# Patient Record
Sex: Female | Born: 1945 | Race: White | Hispanic: No | Smoking: Current every day smoker
Health system: Southern US, Community
[De-identification: ages and names within clinical notes are randomized; demographics above are authoritative.]

## PROBLEM LIST (undated history)

## (undated) DIAGNOSIS — E079 Disorder of thyroid, unspecified: Secondary | ICD-10-CM

## (undated) HISTORY — DX: Disorder of thyroid, unspecified: E07.9

---

## 2011-08-06 DIAGNOSIS — D497 Neoplasm of unspecified behavior of endocrine glands and other parts of nervous system: Secondary | ICD-10-CM | POA: Diagnosis not present

## 2011-08-06 DIAGNOSIS — D34 Benign neoplasm of thyroid gland: Secondary | ICD-10-CM | POA: Diagnosis not present

## 2011-09-12 DIAGNOSIS — Z79899 Other long term (current) drug therapy: Secondary | ICD-10-CM | POA: Diagnosis not present

## 2011-09-12 DIAGNOSIS — E042 Nontoxic multinodular goiter: Secondary | ICD-10-CM | POA: Diagnosis not present

## 2011-09-12 DIAGNOSIS — I10 Essential (primary) hypertension: Secondary | ICD-10-CM | POA: Diagnosis not present

## 2011-09-12 DIAGNOSIS — E785 Hyperlipidemia, unspecified: Secondary | ICD-10-CM | POA: Diagnosis not present

## 2011-11-26 DIAGNOSIS — I2510 Atherosclerotic heart disease of native coronary artery without angina pectoris: Secondary | ICD-10-CM | POA: Diagnosis not present

## 2011-12-26 DIAGNOSIS — R079 Chest pain, unspecified: Secondary | ICD-10-CM | POA: Diagnosis not present

## 2011-12-26 DIAGNOSIS — I2510 Atherosclerotic heart disease of native coronary artery without angina pectoris: Secondary | ICD-10-CM | POA: Diagnosis not present

## 2011-12-26 DIAGNOSIS — R0602 Shortness of breath: Secondary | ICD-10-CM | POA: Diagnosis not present

## 2012-03-03 DIAGNOSIS — M79609 Pain in unspecified limb: Secondary | ICD-10-CM | POA: Diagnosis not present

## 2012-04-18 DIAGNOSIS — M79609 Pain in unspecified limb: Secondary | ICD-10-CM | POA: Diagnosis not present

## 2012-04-18 DIAGNOSIS — F411 Generalized anxiety disorder: Secondary | ICD-10-CM | POA: Diagnosis not present

## 2012-04-18 DIAGNOSIS — Z23 Encounter for immunization: Secondary | ICD-10-CM | POA: Diagnosis not present

## 2012-09-01 DIAGNOSIS — I2510 Atherosclerotic heart disease of native coronary artery without angina pectoris: Secondary | ICD-10-CM | POA: Diagnosis not present

## 2012-09-01 DIAGNOSIS — Z01810 Encounter for preprocedural cardiovascular examination: Secondary | ICD-10-CM | POA: Diagnosis not present

## 2012-09-01 DIAGNOSIS — I10 Essential (primary) hypertension: Secondary | ICD-10-CM | POA: Diagnosis not present

## 2012-09-01 DIAGNOSIS — E785 Hyperlipidemia, unspecified: Secondary | ICD-10-CM | POA: Diagnosis not present

## 2012-09-01 DIAGNOSIS — G459 Transient cerebral ischemic attack, unspecified: Secondary | ICD-10-CM | POA: Diagnosis not present

## 2012-09-15 DIAGNOSIS — M79609 Pain in unspecified limb: Secondary | ICD-10-CM | POA: Diagnosis not present

## 2012-09-15 DIAGNOSIS — G459 Transient cerebral ischemic attack, unspecified: Secondary | ICD-10-CM | POA: Diagnosis not present

## 2012-09-18 DIAGNOSIS — Z79899 Other long term (current) drug therapy: Secondary | ICD-10-CM | POA: Diagnosis not present

## 2012-09-18 DIAGNOSIS — I10 Essential (primary) hypertension: Secondary | ICD-10-CM | POA: Diagnosis not present

## 2012-09-18 DIAGNOSIS — E042 Nontoxic multinodular goiter: Secondary | ICD-10-CM | POA: Diagnosis not present

## 2012-09-18 DIAGNOSIS — F411 Generalized anxiety disorder: Secondary | ICD-10-CM | POA: Diagnosis not present

## 2012-09-18 DIAGNOSIS — E785 Hyperlipidemia, unspecified: Secondary | ICD-10-CM | POA: Diagnosis not present

## 2012-10-23 DIAGNOSIS — B009 Herpesviral infection, unspecified: Secondary | ICD-10-CM | POA: Diagnosis not present

## 2012-10-23 DIAGNOSIS — R509 Fever, unspecified: Secondary | ICD-10-CM | POA: Diagnosis not present

## 2012-10-23 DIAGNOSIS — K130 Diseases of lips: Secondary | ICD-10-CM | POA: Diagnosis not present

## 2012-12-01 DIAGNOSIS — M549 Dorsalgia, unspecified: Secondary | ICD-10-CM | POA: Diagnosis not present

## 2013-08-03 DIAGNOSIS — M549 Dorsalgia, unspecified: Secondary | ICD-10-CM | POA: Diagnosis not present

## 2013-08-13 DIAGNOSIS — M545 Low back pain, unspecified: Secondary | ICD-10-CM | POA: Diagnosis not present

## 2013-08-13 DIAGNOSIS — S335XXA Sprain of ligaments of lumbar spine, initial encounter: Secondary | ICD-10-CM | POA: Diagnosis not present

## 2013-08-13 DIAGNOSIS — IMO0002 Reserved for concepts with insufficient information to code with codable children: Secondary | ICD-10-CM | POA: Diagnosis not present

## 2013-08-13 DIAGNOSIS — W108XXA Fall (on) (from) other stairs and steps, initial encounter: Secondary | ICD-10-CM | POA: Diagnosis not present

## 2013-10-05 DIAGNOSIS — Z9181 History of falling: Secondary | ICD-10-CM | POA: Diagnosis not present

## 2013-10-05 DIAGNOSIS — I10 Essential (primary) hypertension: Secondary | ICD-10-CM | POA: Diagnosis not present

## 2013-10-05 DIAGNOSIS — Z8673 Personal history of transient ischemic attack (TIA), and cerebral infarction without residual deficits: Secondary | ICD-10-CM | POA: Diagnosis not present

## 2013-10-05 DIAGNOSIS — J449 Chronic obstructive pulmonary disease, unspecified: Secondary | ICD-10-CM | POA: Diagnosis not present

## 2013-10-05 DIAGNOSIS — Z79899 Other long term (current) drug therapy: Secondary | ICD-10-CM | POA: Diagnosis not present

## 2013-10-05 DIAGNOSIS — IMO0002 Reserved for concepts with insufficient information to code with codable children: Secondary | ICD-10-CM | POA: Diagnosis not present

## 2013-10-05 DIAGNOSIS — W108XXA Fall (on) (from) other stairs and steps, initial encounter: Secondary | ICD-10-CM | POA: Diagnosis not present

## 2013-10-05 DIAGNOSIS — M549 Dorsalgia, unspecified: Secondary | ICD-10-CM | POA: Diagnosis not present

## 2013-11-14 DIAGNOSIS — R52 Pain, unspecified: Secondary | ICD-10-CM | POA: Diagnosis not present

## 2013-11-14 DIAGNOSIS — M545 Low back pain, unspecified: Secondary | ICD-10-CM | POA: Diagnosis not present

## 2013-11-14 DIAGNOSIS — G8929 Other chronic pain: Secondary | ICD-10-CM | POA: Diagnosis not present

## 2013-11-14 DIAGNOSIS — I10 Essential (primary) hypertension: Secondary | ICD-10-CM | POA: Diagnosis not present

## 2013-11-14 DIAGNOSIS — J449 Chronic obstructive pulmonary disease, unspecified: Secondary | ICD-10-CM | POA: Diagnosis not present

## 2013-11-14 DIAGNOSIS — IMO0002 Reserved for concepts with insufficient information to code with codable children: Secondary | ICD-10-CM | POA: Diagnosis not present

## 2013-11-14 DIAGNOSIS — S335XXA Sprain of ligaments of lumbar spine, initial encounter: Secondary | ICD-10-CM | POA: Diagnosis not present

## 2013-11-14 DIAGNOSIS — R296 Repeated falls: Secondary | ICD-10-CM | POA: Diagnosis not present

## 2013-11-14 DIAGNOSIS — F17200 Nicotine dependence, unspecified, uncomplicated: Secondary | ICD-10-CM | POA: Diagnosis not present

## 2013-11-14 DIAGNOSIS — Z79899 Other long term (current) drug therapy: Secondary | ICD-10-CM | POA: Diagnosis not present

## 2013-12-08 DIAGNOSIS — IMO0002 Reserved for concepts with insufficient information to code with codable children: Secondary | ICD-10-CM | POA: Diagnosis not present

## 2013-12-29 DIAGNOSIS — M47817 Spondylosis without myelopathy or radiculopathy, lumbosacral region: Secondary | ICD-10-CM | POA: Diagnosis not present

## 2014-01-12 DIAGNOSIS — IMO0002 Reserved for concepts with insufficient information to code with codable children: Secondary | ICD-10-CM | POA: Diagnosis not present

## 2014-03-02 DIAGNOSIS — F17200 Nicotine dependence, unspecified, uncomplicated: Secondary | ICD-10-CM | POA: Diagnosis not present

## 2014-03-02 DIAGNOSIS — IMO0002 Reserved for concepts with insufficient information to code with codable children: Secondary | ICD-10-CM | POA: Diagnosis not present

## 2014-03-16 DIAGNOSIS — E785 Hyperlipidemia, unspecified: Secondary | ICD-10-CM | POA: Diagnosis not present

## 2014-03-16 DIAGNOSIS — R079 Chest pain, unspecified: Secondary | ICD-10-CM | POA: Diagnosis not present

## 2014-03-16 DIAGNOSIS — Z79899 Other long term (current) drug therapy: Secondary | ICD-10-CM | POA: Diagnosis not present

## 2014-03-16 DIAGNOSIS — I2510 Atherosclerotic heart disease of native coronary artery without angina pectoris: Secondary | ICD-10-CM | POA: Diagnosis not present

## 2014-03-16 DIAGNOSIS — I10 Essential (primary) hypertension: Secondary | ICD-10-CM | POA: Diagnosis not present

## 2014-04-04 DIAGNOSIS — Z23 Encounter for immunization: Secondary | ICD-10-CM | POA: Diagnosis not present

## 2014-04-06 DIAGNOSIS — M47817 Spondylosis without myelopathy or radiculopathy, lumbosacral region: Secondary | ICD-10-CM | POA: Diagnosis not present

## 2014-04-22 DIAGNOSIS — E785 Hyperlipidemia, unspecified: Secondary | ICD-10-CM | POA: Diagnosis not present

## 2014-04-22 DIAGNOSIS — F17200 Nicotine dependence, unspecified, uncomplicated: Secondary | ICD-10-CM | POA: Diagnosis not present

## 2014-04-22 DIAGNOSIS — F411 Generalized anxiety disorder: Secondary | ICD-10-CM | POA: Diagnosis not present

## 2014-04-22 DIAGNOSIS — I10 Essential (primary) hypertension: Secondary | ICD-10-CM | POA: Diagnosis not present

## 2014-04-22 DIAGNOSIS — Z79899 Other long term (current) drug therapy: Secondary | ICD-10-CM | POA: Diagnosis not present

## 2014-05-27 DIAGNOSIS — R928 Other abnormal and inconclusive findings on diagnostic imaging of breast: Secondary | ICD-10-CM | POA: Diagnosis not present

## 2014-05-27 DIAGNOSIS — Z1231 Encounter for screening mammogram for malignant neoplasm of breast: Secondary | ICD-10-CM | POA: Diagnosis not present

## 2014-07-01 DIAGNOSIS — R928 Other abnormal and inconclusive findings on diagnostic imaging of breast: Secondary | ICD-10-CM | POA: Diagnosis not present

## 2014-07-01 DIAGNOSIS — N63 Unspecified lump in breast: Secondary | ICD-10-CM | POA: Diagnosis not present

## 2014-07-13 DIAGNOSIS — M79604 Pain in right leg: Secondary | ICD-10-CM | POA: Diagnosis not present

## 2014-07-13 DIAGNOSIS — M545 Low back pain: Secondary | ICD-10-CM | POA: Diagnosis not present

## 2014-08-17 DIAGNOSIS — R0789 Other chest pain: Secondary | ICD-10-CM | POA: Diagnosis not present

## 2014-08-26 DIAGNOSIS — J449 Chronic obstructive pulmonary disease, unspecified: Secondary | ICD-10-CM | POA: Diagnosis not present

## 2014-08-26 DIAGNOSIS — R918 Other nonspecific abnormal finding of lung field: Secondary | ICD-10-CM | POA: Diagnosis not present

## 2014-08-26 DIAGNOSIS — R0789 Other chest pain: Secondary | ICD-10-CM | POA: Diagnosis not present

## 2014-08-26 DIAGNOSIS — F17210 Nicotine dependence, cigarettes, uncomplicated: Secondary | ICD-10-CM | POA: Diagnosis not present

## 2014-08-26 DIAGNOSIS — I77810 Thoracic aortic ectasia: Secondary | ICD-10-CM | POA: Diagnosis not present

## 2014-08-26 DIAGNOSIS — R1011 Right upper quadrant pain: Secondary | ICD-10-CM | POA: Diagnosis not present

## 2014-08-26 DIAGNOSIS — I10 Essential (primary) hypertension: Secondary | ICD-10-CM | POA: Diagnosis not present

## 2014-08-26 DIAGNOSIS — J439 Emphysema, unspecified: Secondary | ICD-10-CM | POA: Diagnosis not present

## 2014-09-15 DIAGNOSIS — I10 Essential (primary) hypertension: Secondary | ICD-10-CM | POA: Diagnosis not present

## 2014-09-15 DIAGNOSIS — F17210 Nicotine dependence, cigarettes, uncomplicated: Secondary | ICD-10-CM | POA: Diagnosis not present

## 2014-09-15 DIAGNOSIS — J180 Bronchopneumonia, unspecified organism: Secondary | ICD-10-CM | POA: Diagnosis not present

## 2014-09-15 DIAGNOSIS — Z8673 Personal history of transient ischemic attack (TIA), and cerebral infarction without residual deficits: Secondary | ICD-10-CM | POA: Diagnosis not present

## 2014-09-15 DIAGNOSIS — E780 Pure hypercholesterolemia: Secondary | ICD-10-CM | POA: Diagnosis not present

## 2014-09-15 DIAGNOSIS — J449 Chronic obstructive pulmonary disease, unspecified: Secondary | ICD-10-CM | POA: Diagnosis not present

## 2014-09-20 DIAGNOSIS — I10 Essential (primary) hypertension: Secondary | ICD-10-CM | POA: Diagnosis not present

## 2014-09-20 DIAGNOSIS — I2510 Atherosclerotic heart disease of native coronary artery without angina pectoris: Secondary | ICD-10-CM | POA: Diagnosis not present

## 2014-09-20 DIAGNOSIS — R0602 Shortness of breath: Secondary | ICD-10-CM | POA: Diagnosis not present

## 2014-09-20 DIAGNOSIS — E785 Hyperlipidemia, unspecified: Secondary | ICD-10-CM | POA: Diagnosis not present

## 2014-09-28 DIAGNOSIS — R079 Chest pain, unspecified: Secondary | ICD-10-CM | POA: Diagnosis not present

## 2014-10-27 DIAGNOSIS — R319 Hematuria, unspecified: Secondary | ICD-10-CM | POA: Diagnosis not present

## 2014-10-27 DIAGNOSIS — F5104 Psychophysiologic insomnia: Secondary | ICD-10-CM | POA: Diagnosis not present

## 2014-10-27 DIAGNOSIS — R300 Dysuria: Secondary | ICD-10-CM | POA: Diagnosis not present

## 2014-10-27 DIAGNOSIS — I159 Secondary hypertension, unspecified: Secondary | ICD-10-CM | POA: Diagnosis not present

## 2014-10-27 DIAGNOSIS — F419 Anxiety disorder, unspecified: Secondary | ICD-10-CM | POA: Diagnosis not present

## 2014-10-27 DIAGNOSIS — M1990 Unspecified osteoarthritis, unspecified site: Secondary | ICD-10-CM | POA: Diagnosis not present

## 2014-10-27 DIAGNOSIS — J449 Chronic obstructive pulmonary disease, unspecified: Secondary | ICD-10-CM | POA: Diagnosis not present

## 2014-10-27 DIAGNOSIS — I10 Essential (primary) hypertension: Secondary | ICD-10-CM | POA: Diagnosis not present

## 2014-11-26 DIAGNOSIS — F17200 Nicotine dependence, unspecified, uncomplicated: Secondary | ICD-10-CM | POA: Diagnosis not present

## 2014-11-26 DIAGNOSIS — Z79899 Other long term (current) drug therapy: Secondary | ICD-10-CM | POA: Diagnosis not present

## 2014-11-26 DIAGNOSIS — I10 Essential (primary) hypertension: Secondary | ICD-10-CM | POA: Diagnosis not present

## 2014-11-26 DIAGNOSIS — R319 Hematuria, unspecified: Secondary | ICD-10-CM | POA: Diagnosis not present

## 2014-11-26 DIAGNOSIS — E785 Hyperlipidemia, unspecified: Secondary | ICD-10-CM | POA: Diagnosis not present

## 2014-11-26 DIAGNOSIS — F5104 Psychophysiologic insomnia: Secondary | ICD-10-CM | POA: Diagnosis not present

## 2014-11-26 DIAGNOSIS — Z6821 Body mass index (BMI) 21.0-21.9, adult: Secondary | ICD-10-CM | POA: Diagnosis not present

## 2014-12-07 DIAGNOSIS — R312 Other microscopic hematuria: Secondary | ICD-10-CM | POA: Diagnosis not present

## 2015-02-01 DIAGNOSIS — Z1211 Encounter for screening for malignant neoplasm of colon: Secondary | ICD-10-CM | POA: Diagnosis not present

## 2015-02-01 DIAGNOSIS — G629 Polyneuropathy, unspecified: Secondary | ICD-10-CM | POA: Diagnosis not present

## 2015-02-01 DIAGNOSIS — Z6821 Body mass index (BMI) 21.0-21.9, adult: Secondary | ICD-10-CM | POA: Diagnosis not present

## 2015-02-01 DIAGNOSIS — M4807 Spinal stenosis, lumbosacral region: Secondary | ICD-10-CM | POA: Diagnosis not present

## 2015-02-02 DIAGNOSIS — R4182 Altered mental status, unspecified: Secondary | ICD-10-CM | POA: Diagnosis not present

## 2015-02-02 DIAGNOSIS — R45851 Suicidal ideations: Secondary | ICD-10-CM | POA: Diagnosis not present

## 2015-02-02 DIAGNOSIS — R4781 Slurred speech: Secondary | ICD-10-CM | POA: Diagnosis not present

## 2015-02-02 DIAGNOSIS — F17210 Nicotine dependence, cigarettes, uncomplicated: Secondary | ICD-10-CM | POA: Diagnosis not present

## 2015-02-02 DIAGNOSIS — T40602A Poisoning by unspecified narcotics, intentional self-harm, initial encounter: Secondary | ICD-10-CM | POA: Diagnosis not present

## 2015-02-02 DIAGNOSIS — M1990 Unspecified osteoarthritis, unspecified site: Secondary | ICD-10-CM | POA: Diagnosis not present

## 2015-02-02 DIAGNOSIS — E870 Hyperosmolality and hypernatremia: Secondary | ICD-10-CM | POA: Diagnosis not present

## 2015-02-02 DIAGNOSIS — F10121 Alcohol abuse with intoxication delirium: Secondary | ICD-10-CM | POA: Diagnosis not present

## 2015-02-02 DIAGNOSIS — Z8673 Personal history of transient ischemic attack (TIA), and cerebral infarction without residual deficits: Secondary | ICD-10-CM | POA: Diagnosis not present

## 2015-02-02 DIAGNOSIS — E780 Pure hypercholesterolemia: Secondary | ICD-10-CM | POA: Diagnosis not present

## 2015-02-02 DIAGNOSIS — I10 Essential (primary) hypertension: Secondary | ICD-10-CM | POA: Diagnosis not present

## 2015-02-02 DIAGNOSIS — E876 Hypokalemia: Secondary | ICD-10-CM | POA: Diagnosis not present

## 2015-02-02 DIAGNOSIS — I959 Hypotension, unspecified: Secondary | ICD-10-CM | POA: Diagnosis not present

## 2015-02-02 DIAGNOSIS — F419 Anxiety disorder, unspecified: Secondary | ICD-10-CM | POA: Diagnosis not present

## 2015-02-02 DIAGNOSIS — J449 Chronic obstructive pulmonary disease, unspecified: Secondary | ICD-10-CM | POA: Diagnosis not present

## 2015-02-02 DIAGNOSIS — Z79899 Other long term (current) drug therapy: Secondary | ICD-10-CM | POA: Diagnosis not present

## 2015-02-02 DIAGNOSIS — R0902 Hypoxemia: Secondary | ICD-10-CM | POA: Diagnosis not present

## 2015-02-02 DIAGNOSIS — T404X2A Poisoning by other synthetic narcotics, intentional self-harm, initial encounter: Secondary | ICD-10-CM | POA: Diagnosis not present

## 2015-02-02 DIAGNOSIS — J811 Chronic pulmonary edema: Secondary | ICD-10-CM | POA: Diagnosis not present

## 2015-02-02 DIAGNOSIS — I952 Hypotension due to drugs: Secondary | ICD-10-CM | POA: Diagnosis not present

## 2015-02-24 DIAGNOSIS — E785 Hyperlipidemia, unspecified: Secondary | ICD-10-CM | POA: Diagnosis not present

## 2015-02-24 DIAGNOSIS — R636 Underweight: Secondary | ICD-10-CM | POA: Diagnosis not present

## 2015-02-24 DIAGNOSIS — R0602 Shortness of breath: Secondary | ICD-10-CM | POA: Diagnosis not present

## 2015-02-24 DIAGNOSIS — Z79899 Other long term (current) drug therapy: Secondary | ICD-10-CM | POA: Diagnosis not present

## 2015-02-24 DIAGNOSIS — J449 Chronic obstructive pulmonary disease, unspecified: Secondary | ICD-10-CM | POA: Diagnosis not present

## 2015-03-11 DIAGNOSIS — Z86010 Personal history of colonic polyps: Secondary | ICD-10-CM | POA: Diagnosis not present

## 2015-03-11 DIAGNOSIS — Z1211 Encounter for screening for malignant neoplasm of colon: Secondary | ICD-10-CM | POA: Diagnosis not present

## 2015-04-07 DIAGNOSIS — J449 Chronic obstructive pulmonary disease, unspecified: Secondary | ICD-10-CM | POA: Diagnosis not present

## 2015-04-07 DIAGNOSIS — I10 Essential (primary) hypertension: Secondary | ICD-10-CM | POA: Diagnosis not present

## 2015-04-07 DIAGNOSIS — G43909 Migraine, unspecified, not intractable, without status migrainosus: Secondary | ICD-10-CM | POA: Diagnosis not present

## 2015-04-07 DIAGNOSIS — D122 Benign neoplasm of ascending colon: Secondary | ICD-10-CM | POA: Diagnosis not present

## 2015-04-07 DIAGNOSIS — K5730 Diverticulosis of large intestine without perforation or abscess without bleeding: Secondary | ICD-10-CM | POA: Diagnosis not present

## 2015-04-07 DIAGNOSIS — K648 Other hemorrhoids: Secondary | ICD-10-CM | POA: Diagnosis not present

## 2015-04-07 DIAGNOSIS — D123 Benign neoplasm of transverse colon: Secondary | ICD-10-CM | POA: Diagnosis not present

## 2015-04-07 DIAGNOSIS — Z9049 Acquired absence of other specified parts of digestive tract: Secondary | ICD-10-CM | POA: Diagnosis not present

## 2015-04-07 DIAGNOSIS — M1990 Unspecified osteoarthritis, unspecified site: Secondary | ICD-10-CM | POA: Diagnosis not present

## 2015-04-07 DIAGNOSIS — Z1211 Encounter for screening for malignant neoplasm of colon: Secondary | ICD-10-CM | POA: Diagnosis not present

## 2015-04-07 DIAGNOSIS — Z86010 Personal history of colonic polyps: Secondary | ICD-10-CM | POA: Diagnosis not present

## 2015-04-07 DIAGNOSIS — F329 Major depressive disorder, single episode, unspecified: Secondary | ICD-10-CM | POA: Diagnosis not present

## 2015-04-07 DIAGNOSIS — F419 Anxiety disorder, unspecified: Secondary | ICD-10-CM | POA: Diagnosis not present

## 2015-04-07 DIAGNOSIS — Z8673 Personal history of transient ischemic attack (TIA), and cerebral infarction without residual deficits: Secondary | ICD-10-CM | POA: Diagnosis not present

## 2015-04-07 DIAGNOSIS — M4807 Spinal stenosis, lumbosacral region: Secondary | ICD-10-CM | POA: Diagnosis not present

## 2015-04-07 DIAGNOSIS — E785 Hyperlipidemia, unspecified: Secondary | ICD-10-CM | POA: Diagnosis not present

## 2015-04-07 DIAGNOSIS — F4310 Post-traumatic stress disorder, unspecified: Secondary | ICD-10-CM | POA: Diagnosis not present

## 2015-04-07 DIAGNOSIS — E049 Nontoxic goiter, unspecified: Secondary | ICD-10-CM | POA: Diagnosis not present

## 2015-04-07 DIAGNOSIS — F17210 Nicotine dependence, cigarettes, uncomplicated: Secondary | ICD-10-CM | POA: Diagnosis not present

## 2015-04-07 DIAGNOSIS — K219 Gastro-esophageal reflux disease without esophagitis: Secondary | ICD-10-CM | POA: Diagnosis not present

## 2015-04-07 DIAGNOSIS — K635 Polyp of colon: Secondary | ICD-10-CM | POA: Diagnosis not present

## 2015-05-01 DIAGNOSIS — K130 Diseases of lips: Secondary | ICD-10-CM | POA: Diagnosis not present

## 2015-05-13 DIAGNOSIS — K5900 Constipation, unspecified: Secondary | ICD-10-CM | POA: Diagnosis not present

## 2015-05-13 DIAGNOSIS — M1990 Unspecified osteoarthritis, unspecified site: Secondary | ICD-10-CM | POA: Diagnosis not present

## 2015-05-13 DIAGNOSIS — E785 Hyperlipidemia, unspecified: Secondary | ICD-10-CM | POA: Diagnosis not present

## 2015-05-13 DIAGNOSIS — L559 Sunburn, unspecified: Secondary | ICD-10-CM | POA: Diagnosis not present

## 2015-05-13 DIAGNOSIS — F5104 Psychophysiologic insomnia: Secondary | ICD-10-CM | POA: Diagnosis not present

## 2015-06-01 DIAGNOSIS — H04123 Dry eye syndrome of bilateral lacrimal glands: Secondary | ICD-10-CM | POA: Diagnosis not present

## 2015-06-01 DIAGNOSIS — H527 Unspecified disorder of refraction: Secondary | ICD-10-CM | POA: Diagnosis not present

## 2015-07-19 DIAGNOSIS — E039 Hypothyroidism, unspecified: Secondary | ICD-10-CM | POA: Diagnosis not present

## 2015-07-19 DIAGNOSIS — Z681 Body mass index (BMI) 19 or less, adult: Secondary | ICD-10-CM | POA: Diagnosis not present

## 2015-07-19 DIAGNOSIS — M545 Low back pain: Secondary | ICD-10-CM | POA: Diagnosis not present

## 2015-07-19 DIAGNOSIS — F11220 Opioid dependence with intoxication, uncomplicated: Secondary | ICD-10-CM | POA: Diagnosis not present

## 2015-07-19 DIAGNOSIS — Z7689 Persons encountering health services in other specified circumstances: Secondary | ICD-10-CM | POA: Diagnosis not present

## 2015-07-19 DIAGNOSIS — E1165 Type 2 diabetes mellitus with hyperglycemia: Secondary | ICD-10-CM | POA: Diagnosis not present

## 2015-07-19 DIAGNOSIS — E790 Hyperuricemia without signs of inflammatory arthritis and tophaceous disease: Secondary | ICD-10-CM | POA: Diagnosis not present

## 2015-07-19 DIAGNOSIS — R252 Cramp and spasm: Secondary | ICD-10-CM | POA: Diagnosis not present

## 2015-07-19 DIAGNOSIS — R0600 Dyspnea, unspecified: Secondary | ICD-10-CM | POA: Diagnosis not present

## 2015-07-19 DIAGNOSIS — E782 Mixed hyperlipidemia: Secondary | ICD-10-CM | POA: Diagnosis not present

## 2015-07-19 DIAGNOSIS — B179 Acute viral hepatitis, unspecified: Secondary | ICD-10-CM | POA: Diagnosis not present

## 2015-07-19 DIAGNOSIS — J449 Chronic obstructive pulmonary disease, unspecified: Secondary | ICD-10-CM | POA: Diagnosis not present

## 2015-07-19 DIAGNOSIS — R05 Cough: Secondary | ICD-10-CM | POA: Diagnosis not present

## 2015-07-19 DIAGNOSIS — Z79891 Long term (current) use of opiate analgesic: Secondary | ICD-10-CM | POA: Diagnosis not present

## 2015-07-19 DIAGNOSIS — I10 Essential (primary) hypertension: Secondary | ICD-10-CM | POA: Diagnosis not present

## 2015-07-21 DIAGNOSIS — J40 Bronchitis, not specified as acute or chronic: Secondary | ICD-10-CM | POA: Diagnosis not present

## 2015-07-21 DIAGNOSIS — J449 Chronic obstructive pulmonary disease, unspecified: Secondary | ICD-10-CM | POA: Diagnosis not present

## 2015-07-21 DIAGNOSIS — M5134 Other intervertebral disc degeneration, thoracic region: Secondary | ICD-10-CM | POA: Diagnosis not present

## 2015-07-27 DIAGNOSIS — J449 Chronic obstructive pulmonary disease, unspecified: Secondary | ICD-10-CM | POA: Diagnosis not present

## 2015-07-27 DIAGNOSIS — Z681 Body mass index (BMI) 19 or less, adult: Secondary | ICD-10-CM | POA: Diagnosis not present

## 2015-07-27 DIAGNOSIS — R0981 Nasal congestion: Secondary | ICD-10-CM | POA: Diagnosis not present

## 2015-07-27 DIAGNOSIS — R05 Cough: Secondary | ICD-10-CM | POA: Diagnosis not present

## 2017-12-18 ENCOUNTER — Encounter: Payer: Self-pay | Admitting: Cardiology

## 2017-12-18 ENCOUNTER — Ambulatory Visit (INDEPENDENT_AMBULATORY_CARE_PROVIDER_SITE_OTHER): Payer: Medicare Other | Admitting: Cardiology

## 2017-12-18 ENCOUNTER — Encounter: Payer: Self-pay | Admitting: *Deleted

## 2017-12-18 DIAGNOSIS — I2510 Atherosclerotic heart disease of native coronary artery without angina pectoris: Secondary | ICD-10-CM | POA: Insufficient documentation

## 2017-12-18 DIAGNOSIS — F17200 Nicotine dependence, unspecified, uncomplicated: Secondary | ICD-10-CM

## 2017-12-18 DIAGNOSIS — I25119 Atherosclerotic heart disease of native coronary artery with unspecified angina pectoris: Secondary | ICD-10-CM

## 2017-12-18 DIAGNOSIS — E785 Hyperlipidemia, unspecified: Secondary | ICD-10-CM | POA: Insufficient documentation

## 2017-12-18 DIAGNOSIS — R072 Precordial pain: Secondary | ICD-10-CM

## 2017-12-18 DIAGNOSIS — IMO0001 Reserved for inherently not codable concepts without codable children: Secondary | ICD-10-CM | POA: Insufficient documentation

## 2017-12-18 MED ORDER — LISINOPRIL 10 MG PO TABS: 10.0000 mg | ORAL_TABLET | Freq: Every day | ORAL | 6 refills | Status: AC

## 2017-12-18 NOTE — Patient Instructions (Signed)
Medication Instructions:  Your physician has recommended you make the following change in your medication: STOP hydrochlorothiazide DECREASE lisinopril 10 mg daily   Labwork: None  Testing/Procedures: You had an EKG today.   Your physician has requested that you have an echocardiogram. Echocardiography is a painless test that uses sound waves to create images of your heart. It provides your doctor with information about the size and shape of your heart and how well your heart's chambers and valves are working. This procedure takes approximately one hour. There are no restrictions for this procedure.  Your physician has requested that you have en exercise stress myoview. For further information please visit [email or web address]. Please follow instruction sheet, as given.   Follow-Up: Your physician recommends that you schedule a follow-up appointment in: 6 weeks.  If you need a refill on your cardiac medications before your next appointment, please call your pharmacy.   Thank you for choosing [facility]! Robyne Peers, RN [phone]

## 2017-12-18 NOTE — Progress Notes (Signed)
Cardiology Consultation:    Date:  12/18/2017   ID:  Susan Levine, DOB 17-Mar-1946, MRN [id number]  PCP:  Welford Roche, NP  Cardiologist:  Jenne Campus, MD   Referring MD: No ref. provider found   Chief Complaint  Patient presents with  . Follow-up  I was seen last time more than 5 years ago  History of Present Illness:    Susan Levine is a 72 y.o. female who is being seen today for the evaluation of coronary artery disease at the request of No ref. provider found.  I had a pleasure to see her many years ago I have no documentation of this however.  She tells me that cardiac catheterization was done and she was told to have 5 blockages 1 borderline rest insignificant.  She disappeared from follow-up.  She want to be reestablished as a patient because of multiple complaints.  In complaint appears to be weakness fatigue and tiredness that she is being experiencing for last 3 months.  She said anytime she tried to do something she will get tired and exhausted.  She also described to have pain in multiple locations in the matter-of-fact at the very beginning of the visit she started complaining of having pain everywhere and she requesting pain medication.  She is disappointed with her primary care physician who apparently took some medications away from her.  Pain are localized in the back she is wearing some Lidoderm patch there.  Also described to have some chest pain that is not related to exercise can happen at any time she could be sitting doing nothing she will develop pain.  No aggravating or relieving factors.  She grades the pain 5-10 scale up to 10.  There is no shortness of breath no palpitations associated with this sensation.  Her ability to exercise is limited because of weakness and fatigue.  Her risk factors for coronary artery disease include smoking she still smokes 1 pack/day.  She does have history of hypertension however now with dealing with opposite problem her blood  pressure being low.  I checked blood pressure personally and her blood pressure is 90/60 right now.  She does not complain of having any dizziness she is alert awake oriented x3.  Past Medical History:  Diagnosis Date  . Thyroid disease       Current Medications: Current Meds  Medication Sig  . hydrochlorothiazide (HYDRODIURIL) 25 MG tablet Take 1 tablet by mouth daily.  Marland Kitchen levothyroxine (SYNTHROID, LEVOTHROID) 50 MCG tablet Take 1 tablet by mouth daily.  Marland Kitchen lisinopril (PRINIVIL,ZESTRIL) 20 MG tablet Take 1 tablet by mouth daily.  Marland Kitchen LORazepam (ATIVAN) 2 MG tablet Take 1 tablet by mouth daily.  . mirtazapine (REMERON) 15 MG tablet Take 1 tablet by mouth daily.  Marland Kitchen oxybutynin (DITROPAN-XL) 10 MG 24 hr tablet Take 1 mg by mouth daily.  Marland Kitchen PROAIR HFA 108 (90 Base) MCG/ACT inhaler Inhale 2 puffs into the lungs every 6 (six) hours as needed.  . SYMBICORT 160-4.5 MCG/ACT inhaler Inhale 2 puffs into the lungs.  . traMADol (ULTRAM) 50 MG tablet Take 1 tablet by mouth daily.     Allergies:   Patient has no known allergies.   Social History   Socioeconomic History  . Marital status: Divorced    Spouse name: Not on file  . Number of children: Not on file  . Years of education: Not on file  . Highest education level: Not on file  Occupational History  . Not  on file  Social Needs  . Financial resource strain: Not on file  . Food insecurity:    Worry: Not on file    Inability: Not on file  . Transportation needs:    Medical: Not on file    Non-medical: Not on file  Tobacco Use  . Smoking status: Current Every Day Smoker    Packs/day: 1.00    Types: Cigarettes  . Smokeless tobacco: Never Used  Substance and Sexual Activity  . Alcohol use: Yes    Comment: Little to none  . Drug use: Never  . Sexual activity: Not on file  Lifestyle  . Physical activity:    Days per week: Not on file    Minutes per session: Not on file  . Stress: Not on file  Relationships  . Social connections:      Talks on phone: Not on file    Gets together: Not on file    Attends religious service: Not on file    Active member of club or organization: Not on file    Attends meetings of clubs or organizations: Not on file    Relationship status: Not on file  Other Topics Concern  . Not on file  Social History Narrative  . Not on file     Family History: The patient's family history includes Heart attack in her father and mother. ROS:   Please see the history of present illness.    All 14 point review of systems negative except as described per history of present illness.  EKGs/Labs/Other Studies Reviewed:    The following studies were reviewed today: None availabl  EKG:  EKG is  ordered today.  The ekg ordered today demonstrates normal sinus rhythm, normal P interval normal QS complex duration morphology  Recent Labs: No results found for requested labs within last 8760 hours.  Recent Lipid Panel No results found for: CHOL, TRIG, HDL, CHOLHDL, VLDL, LDLCALC, LDLDIRECT  Physical Exam:    VS:  BP (!) 76/56 (BP Location: Right Arm)   Pulse 76   Ht 5\' 6"  (1.676 m)   Wt 136 lb (61.7 kg)   SpO2 98%   BMI 21.95 kg/m     Wt Readings from Last 3 Encounters:  12/18/17 136 lb (61.7 kg)     GEN:  Well nourished, well developed in no acute distress HEENT: Normal NECK: No JVD; No carotid bruits LYMPHATICS: No lymphadenopathy CARDIAC: RRR, no murmurs, no rubs, no gallops RESPIRATORY:  Clear to auscultation without rales, wheezing or rhonchi  ABDOMEN: Soft, non-tender, non-distended MUSCULOSKELETAL:  No edema; No deformity  SKIN: Warm and dry NEUROLOGIC:  Alert and oriented x 3 PSYCHIATRIC:  Normal affect   ASSESSMENT:    1. Coronary artery disease involving native coronary artery of native heart with angina pectoris ([place])   2. Precordial chest pain   3. Smoking   4. Dyslipidemia    PLAN:    In order of problems listed above:  1. Coronary artery disease: We will try to  get report of her cardiac catheterization from [facility].  I have absolutely no documentation in the chart from what was done.  She is taking one baby aspirin every single day which I will continue for now.  I will ask her to have a EKG today.  She will be scheduled to have exercise Cardiolite.  As a part of evaluation I will ask her to have an echocardiogram to assess left ventricular ejection fraction. 2. Precordial  chest pain constellation of a lot of nonspecific symptoms but because of her history of coronary artery disease it would be prudent to perform exercise Cardiolite. 3. Smoking: Obviously she was told to quit. 4. Dyslipidemia: She was told by primary care physician that everything is fine we will try to get a copy of it.  If she truly get coronary artery disease based on cardiac catheterization that hopefully will get results of soon she will need to be put on high intensity statin. 5. Hypertension her blood pressure today is 90/60.  I asked her to discontinue hydrochlorothiazide asked her to cut lisinopril and a half she will take only 10 mg a day.  We will give her 2 glasses of water to drink today before she leaves the room. 6. Chronic pain syndrome.  She requested to be sent to pain clinic.  We will try to see if referral will be physical.   Medication Adjustments/Labs and Tests Ordered: Current medicines are reviewed at length with the patient today.  Concerns regarding medicines are outlined above.  No orders of the defined types were placed in this encounter.  No orders of the defined types were placed in this encounter.   Signed, Park Liter, MD, [facility]. 12/18/2017 8:32 AM    [place]

## 2017-12-24 ENCOUNTER — Telehealth (HOSPITAL_COMMUNITY): Payer: Self-pay | Admitting: *Deleted

## 2017-12-24 NOTE — Telephone Encounter (Signed)
Patient given detailed instructions per Myocardial Perfusion Study Information Sheet for the test on 12/26/17 at 1015. Patient notified to arrive 15 minutes early and that it is imperative to arrive on time for appointment to keep from having the test rescheduled.  If you need to cancel or reschedule your appointment, please call the office within 24 hours of your appointment. . Patient verbalized understanding.Aleiah Mohammed, [place]

## 2017-12-26 ENCOUNTER — Ambulatory Visit (HOSPITAL_BASED_OUTPATIENT_CLINIC_OR_DEPARTMENT_OTHER): Payer: Medicare Other

## 2017-12-26 ENCOUNTER — Ambulatory Visit (HOSPITAL_COMMUNITY): Payer: Medicare Other | Attending: Cardiovascular Disease

## 2017-12-26 ENCOUNTER — Other Ambulatory Visit: Payer: Self-pay

## 2017-12-26 ENCOUNTER — Ambulatory Visit: Payer: Self-pay | Admitting: Cardiology

## 2017-12-26 VITALS — Ht 66.0 in | Wt 136.0 lb

## 2017-12-26 DIAGNOSIS — I25119 Atherosclerotic heart disease of native coronary artery with unspecified angina pectoris: Secondary | ICD-10-CM

## 2017-12-26 DIAGNOSIS — G894 Chronic pain syndrome: Secondary | ICD-10-CM | POA: Insufficient documentation

## 2017-12-26 DIAGNOSIS — I10 Essential (primary) hypertension: Secondary | ICD-10-CM | POA: Diagnosis not present

## 2017-12-26 DIAGNOSIS — R072 Precordial pain: Secondary | ICD-10-CM

## 2017-12-26 DIAGNOSIS — E785 Hyperlipidemia, unspecified: Secondary | ICD-10-CM | POA: Diagnosis not present

## 2017-12-26 DIAGNOSIS — I2510 Atherosclerotic heart disease of native coronary artery without angina pectoris: Secondary | ICD-10-CM

## 2017-12-26 LAB — MYOCARDIAL PERFUSION IMAGING
CHL CUP MPHR: 151 {beats}/min
CHL CUP NUCLEAR SSS: 11
CHL CUP RESTING HR STRESS: 57 {beats}/min
CHL RATE OF PERCEIVED EXERTION: 19
CSEPED: 4 min
CSEPEDS: 30 s
Estimated workload: 6.4 METS
LV dias vol: 55 mL (ref 46–106)
LV sys vol: 16 mL
NUC STRESS TID: 0.68
Peak HR: 121 {beats}/min
Percent HR: 80 %
RATE: 0.33
SDS: 3
SRS: 8

## 2017-12-26 LAB — ECHOCARDIOGRAM COMPLETE
Height: 66 in
WEIGHTICAEL: 2176 [oz_av]

## 2017-12-26 IMAGING — NM NM MISC PROCEDURE
6 series · 36 of 36 positions shown · non-contrast
Comparison: none

[Series 1: wbr_r-proj_st rest · 6.51mm/px · 6 of 64 frames shown]
[frame 6/64]
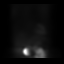
[frame 16/64]
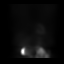
[frame 27/64]
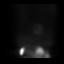
[frame 38/64]
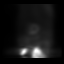
[frame 48/64]
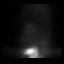
[frame 59/64]
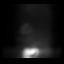

[Series 1: rest · 6.51mm/px · 6 of 64 frames shown]
[frame 6/64]
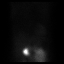
[frame 16/64]
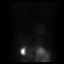
[frame 27/64]
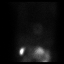
[frame 38/64]
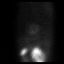
[frame 48/64]
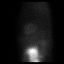
[frame 59/64]
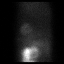

[Series 1: ss-mc · 6.51mm/px · 6 of 64 frames shown]
[frame 6/64]
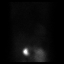
[frame 16/64]
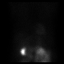
[frame 27/64]
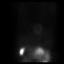
[frame 38/64]
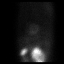
[frame 48/64]
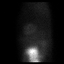
[frame 59/64]
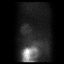

[Series 2: wbr_s-proj_st stress - gated · 6.51mm/px · 6 of 512 frames shown]
[frame 43/512]
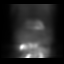
[frame 128/512]
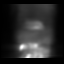
[frame 214/512]
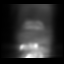
[frame 299/512]
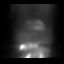
[frame 384/512]
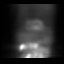
[frame 470/512]
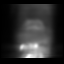

[Series 2: stress - gated · 6.51mm/px · 6 of 512 frames shown]
[frame 43/512]
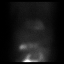
[frame 128/512]
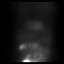
[frame 214/512]
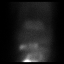
[frame 299/512]
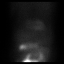
[frame 384/512]
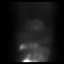
[frame 470/512]
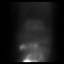

[Series 3: stress - perfusion · 6.51mm/px · 6 of 64 frames shown]
[frame 6/64]
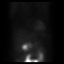
[frame 16/64]
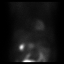
[frame 27/64]
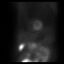
[frame 38/64]
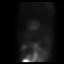
[frame 48/64]
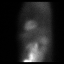
[frame 59/64]
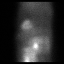

[36 of 36 positions shown; findings below may reference images not displayed]

Canned report from images found in remote index.

Refer to host system for actual result text.

## 2017-12-26 MED ORDER — TECHNETIUM TC 99M TETROFOSMIN IV KIT
33.0000 | PACK | Freq: Once | INTRAVENOUS | Status: AC | PRN
  Administered 2017-12-26: 33 via INTRAVENOUS
  Filled 2017-12-26: qty 33

## 2017-12-26 MED ORDER — REGADENOSON 0.4 MG/5ML IV SOLN
0.4000 mg | Freq: Once | INTRAVENOUS | Status: AC
Start: 2017-12-26 — End: 2017-12-26
  Administered 2017-12-26: 0.4 mg via INTRAVENOUS

## 2017-12-26 MED ORDER — TECHNETIUM TC 99M TETROFOSMIN IV KIT
10.6000 | PACK | Freq: Once | INTRAVENOUS | Status: AC | PRN
  Administered 2017-12-26: 10.6 via INTRAVENOUS
  Filled 2017-12-26: qty 11

## 2018-02-03 ENCOUNTER — Ambulatory Visit: Payer: Medicare Other | Admitting: Cardiology

## 2018-02-27 ENCOUNTER — Ambulatory Visit: Payer: Medicare Other | Admitting: Cardiology

## 2018-03-03 ENCOUNTER — Ambulatory Visit: Payer: Medicare Other | Admitting: Cardiology

## 2018-03-24 ENCOUNTER — Encounter: Payer: Self-pay | Admitting: Gastroenterology

## 2021-03-20 DIAGNOSIS — Z01818 Encounter for other preprocedural examination: Secondary | ICD-10-CM | POA: Diagnosis not present
